# Patient Record
Sex: Female | Born: 1970 | Race: White | Hispanic: No | Marital: Married | Smoking: Never smoker
Health system: Southern US, Community
[De-identification: ages and names within clinical notes are randomized; demographics above are authoritative.]

## PROBLEM LIST (undated history)

## (undated) DIAGNOSIS — E119 Type 2 diabetes mellitus without complications: Secondary | ICD-10-CM

## (undated) DIAGNOSIS — I10 Essential (primary) hypertension: Secondary | ICD-10-CM

## (undated) DIAGNOSIS — G43909 Migraine, unspecified, not intractable, without status migrainosus: Secondary | ICD-10-CM

## (undated) HISTORY — PX: NO PAST SURGERIES: SHX2092

---

## 1999-05-28 ENCOUNTER — Other Ambulatory Visit: Admission: RE | Admit: 1999-05-28 | Discharge: 1999-05-28 | Payer: Self-pay | Admitting: Obstetrics & Gynecology

## 1999-10-01 ENCOUNTER — Encounter: Admission: RE | Admit: 1999-10-01 | Discharge: 1999-12-30 | Payer: Self-pay | Admitting: Obstetrics & Gynecology

## 1999-12-04 ENCOUNTER — Encounter: Payer: Self-pay | Admitting: *Deleted

## 1999-12-04 ENCOUNTER — Observation Stay (HOSPITAL_COMMUNITY): Admission: AD | Admit: 1999-12-04 | Discharge: 1999-12-05 | Payer: Self-pay | Admitting: Obstetrics & Gynecology

## 1999-12-07 ENCOUNTER — Encounter: Payer: Self-pay | Admitting: *Deleted

## 1999-12-08 ENCOUNTER — Inpatient Hospital Stay (HOSPITAL_COMMUNITY): Admission: AD | Admit: 1999-12-08 | Discharge: 1999-12-10 | Payer: Self-pay | Admitting: *Deleted

## 2000-01-23 ENCOUNTER — Other Ambulatory Visit: Admission: RE | Admit: 2000-01-23 | Discharge: 2000-01-23 | Payer: Self-pay | Admitting: Obstetrics & Gynecology

## 2001-03-16 ENCOUNTER — Other Ambulatory Visit: Admission: RE | Admit: 2001-03-16 | Discharge: 2001-03-16 | Payer: Self-pay | Admitting: Obstetrics & Gynecology

## 2002-04-20 ENCOUNTER — Other Ambulatory Visit: Admission: RE | Admit: 2002-04-20 | Discharge: 2002-04-20 | Payer: Self-pay | Admitting: Obstetrics & Gynecology

## 2002-12-07 ENCOUNTER — Inpatient Hospital Stay (HOSPITAL_COMMUNITY): Admission: AD | Admit: 2002-12-07 | Discharge: 2002-12-09 | Payer: Self-pay | Admitting: Obstetrics & Gynecology

## 2003-01-18 ENCOUNTER — Other Ambulatory Visit: Admission: RE | Admit: 2003-01-18 | Discharge: 2003-01-18 | Payer: Self-pay | Admitting: Obstetrics & Gynecology

## 2005-11-14 ENCOUNTER — Inpatient Hospital Stay (HOSPITAL_COMMUNITY): Admission: AD | Admit: 2005-11-14 | Discharge: 2005-11-14 | Payer: Self-pay | Admitting: Obstetrics and Gynecology

## 2005-11-17 ENCOUNTER — Inpatient Hospital Stay (HOSPITAL_COMMUNITY): Admission: AD | Admit: 2005-11-17 | Discharge: 2005-11-19 | Payer: Self-pay | Admitting: Obstetrics & Gynecology

## 2008-01-02 ENCOUNTER — Ambulatory Visit: Payer: Self-pay | Admitting: Family Medicine

## 2008-01-09 ENCOUNTER — Ambulatory Visit: Payer: Self-pay | Admitting: Family Medicine

## 2008-04-19 ENCOUNTER — Ambulatory Visit: Payer: Self-pay | Admitting: Family Medicine

## 2009-10-23 ENCOUNTER — Ambulatory Visit: Payer: Self-pay | Admitting: Internal Medicine

## 2009-11-13 IMAGING — CR DG FOOT COMPLETE 3+V*L*
1 series · 3 of 3 positions shown · non-contrast
Comparison: none

REASON FOR EXAM: Fall, pain
COMMENTS:

PROCEDURE:     MDR - MDR FOOT LT COMP W/OBLQUES  - April 19, 2008  [DATE]
RESULT:     There does not appear to be evidence of fracture, dislocation or
malalignment.

[Series 1: view not recorded · 0.17mm/px · 3 of 3 slices shown]
[im 1/3]
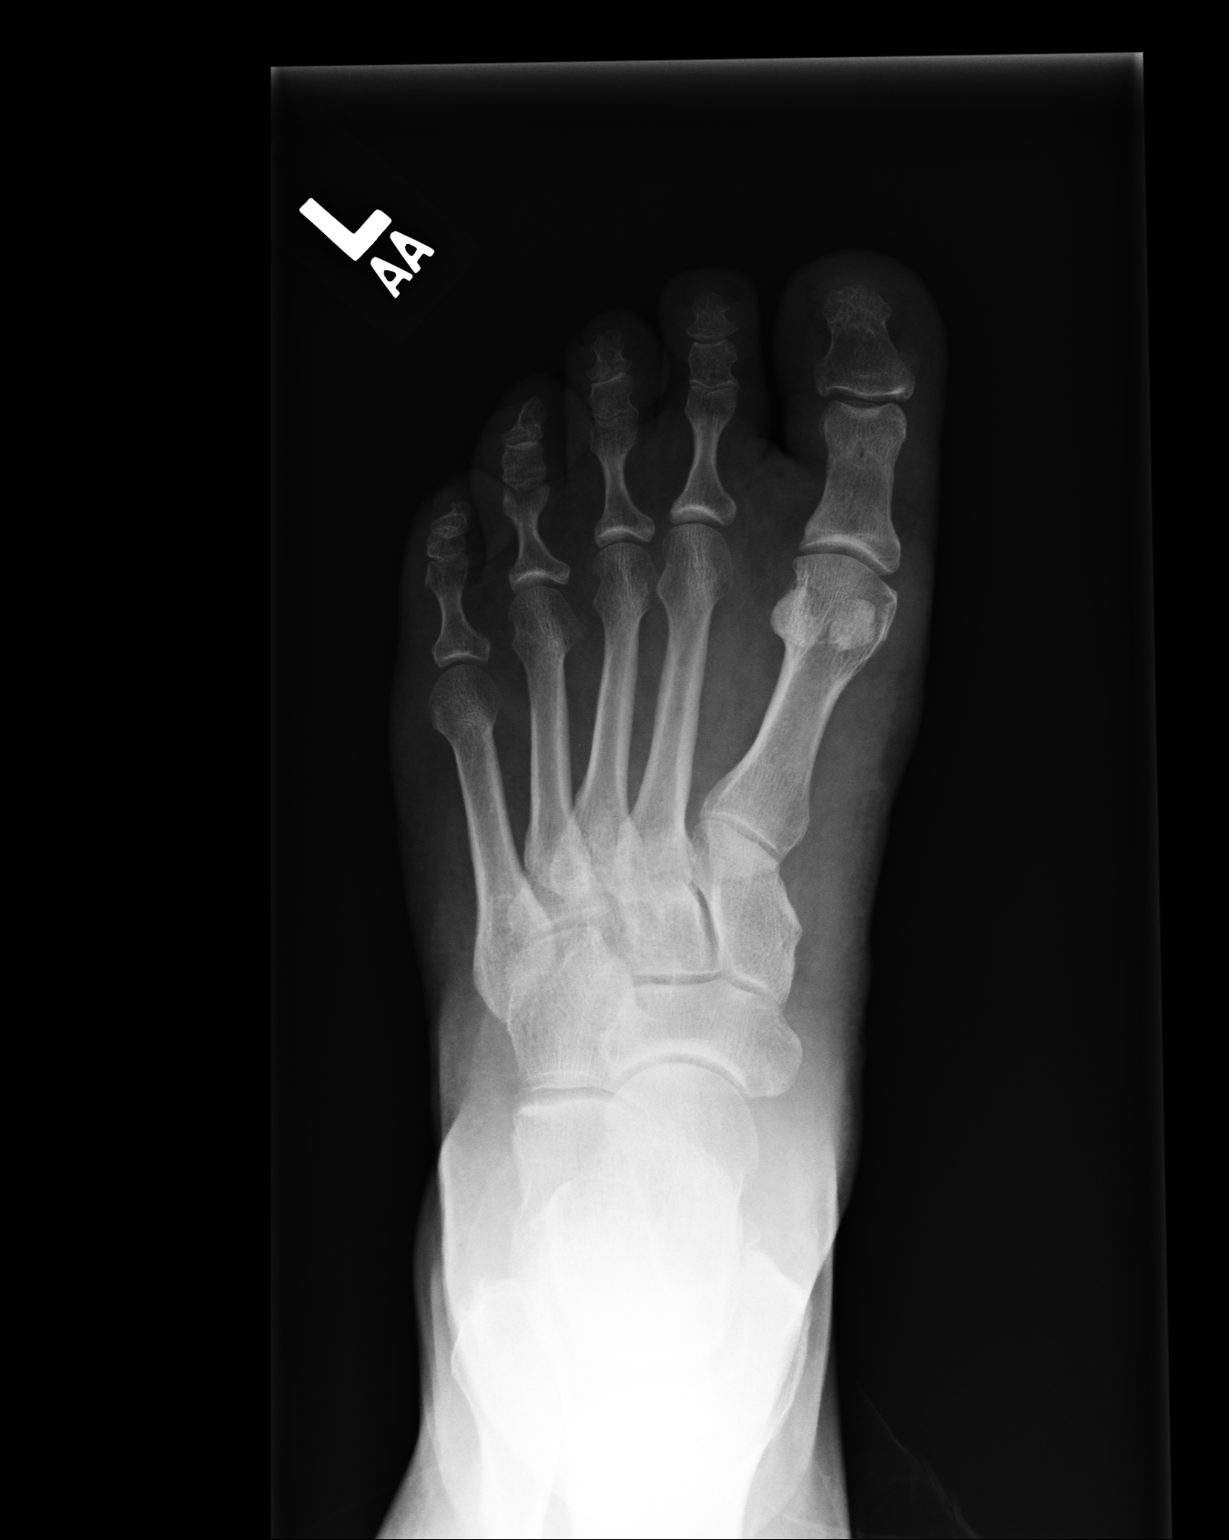
[im 2/3]
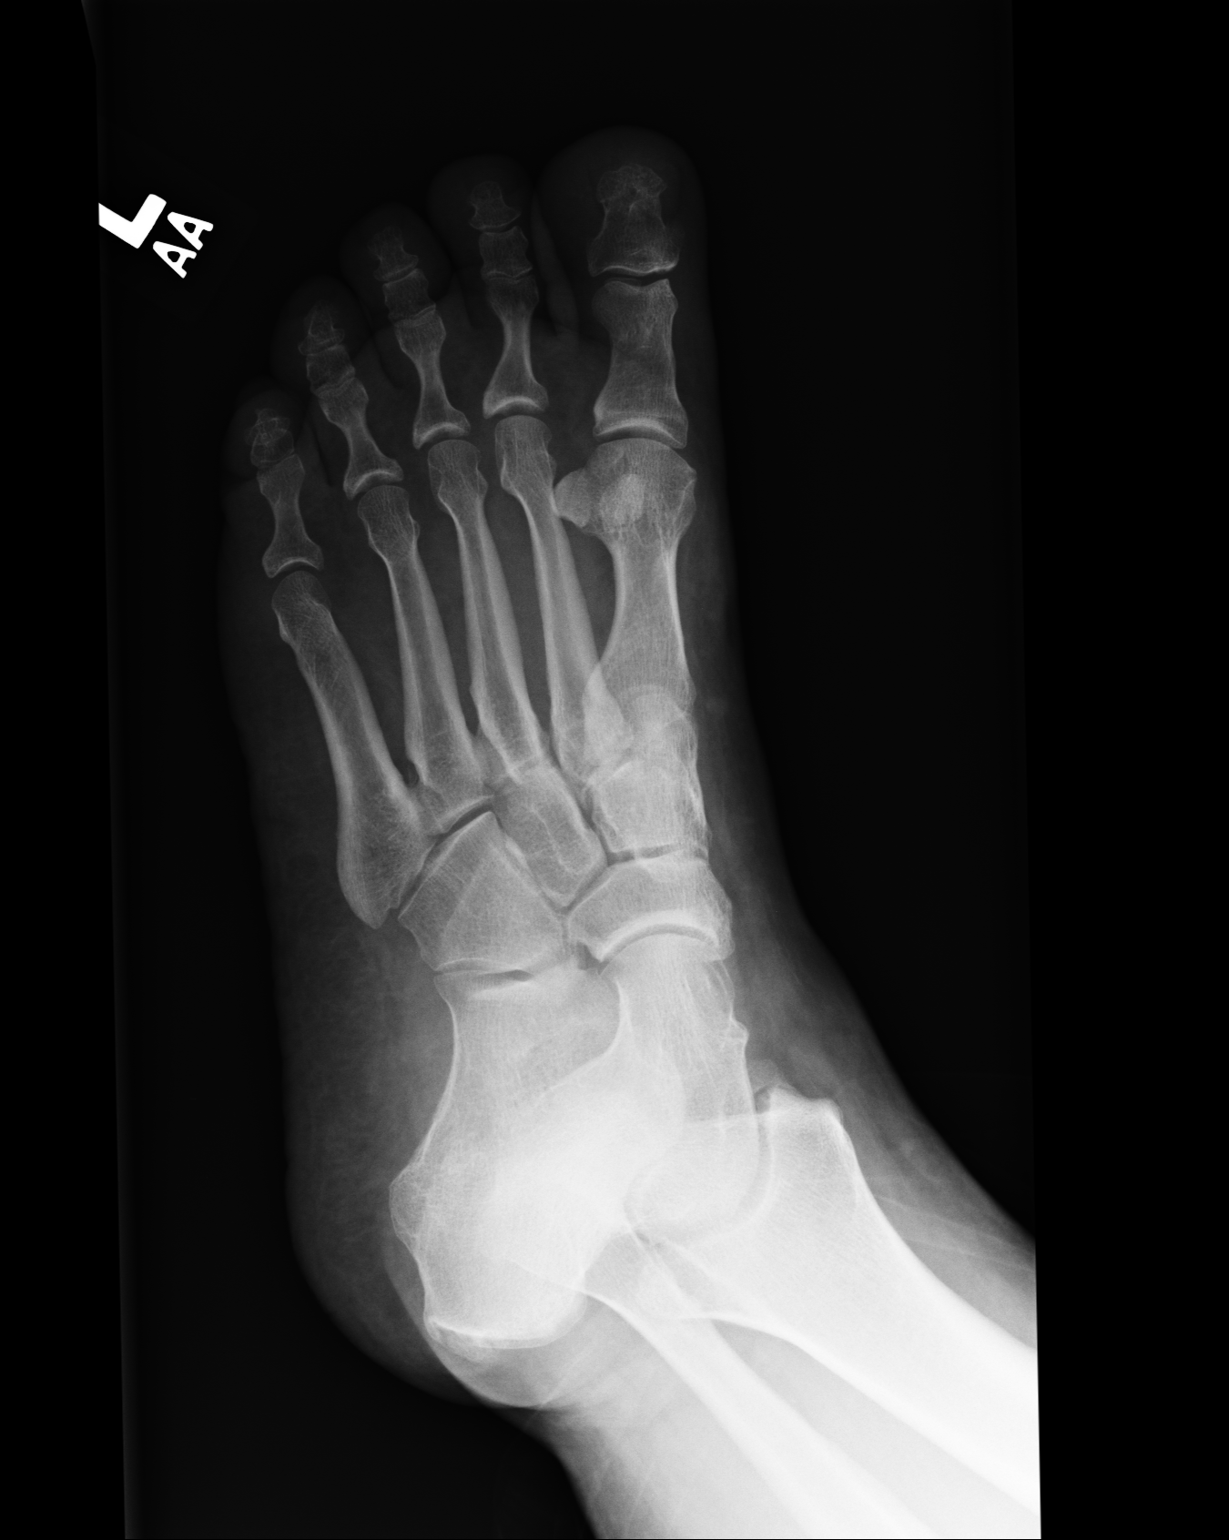
[im 3/3]
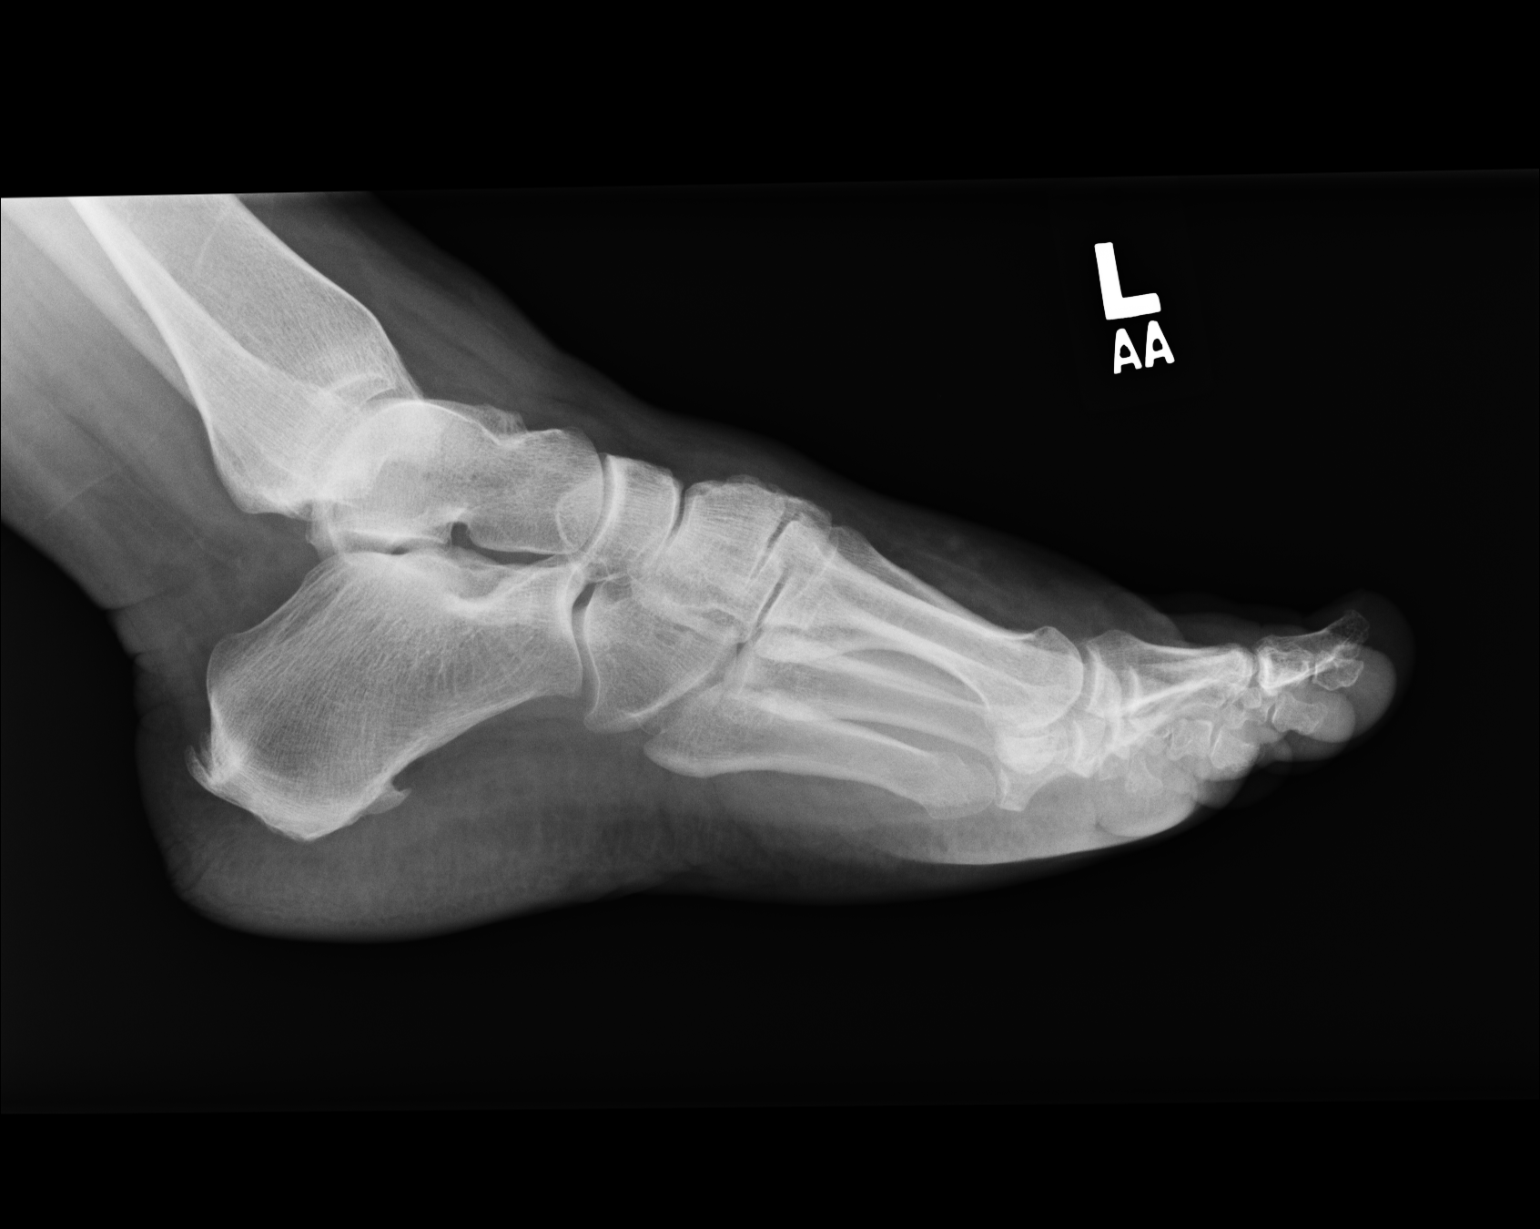

[3 of 3 positions shown; findings below may reference images not displayed]

IMPRESSION: 1.     No evidence of acute osseous abnormalities.
2.     If there is persistent clinical concern or persistent complaints of
pain, repeat evaluation in 7-10 days is recommended, if clinically
warranted.

## 2013-07-25 ENCOUNTER — Ambulatory Visit: Payer: Self-pay | Admitting: Physician Assistant

## 2013-07-25 LAB — RAPID INFLUENZA A&B ANTIGENS

## 2013-10-09 ENCOUNTER — Ambulatory Visit: Payer: Self-pay | Admitting: Family Medicine

## 2014-12-29 ENCOUNTER — Other Ambulatory Visit: Payer: Self-pay | Admitting: Family Medicine

## 2014-12-29 DIAGNOSIS — Z1231 Encounter for screening mammogram for malignant neoplasm of breast: Secondary | ICD-10-CM

## 2015-01-08 ENCOUNTER — Ambulatory Visit
Admission: RE | Admit: 2015-01-08 | Discharge: 2015-01-08 | Disposition: A | Discharge status: Home / Self Care | Payer: Federal, State, Local not specified - PPO | Source: Ambulatory Visit | Attending: Family Medicine | Admitting: Family Medicine

## 2015-01-08 DIAGNOSIS — Z1231 Encounter for screening mammogram for malignant neoplasm of breast: Secondary | ICD-10-CM | POA: Insufficient documentation

## 2017-01-05 ENCOUNTER — Other Ambulatory Visit: Payer: Self-pay | Admitting: Family Medicine

## 2017-01-05 DIAGNOSIS — Z1239 Encounter for other screening for malignant neoplasm of breast: Secondary | ICD-10-CM

## 2018-07-19 ENCOUNTER — Ambulatory Visit
Admission: EM | Admit: 2018-07-19 | Discharge: 2018-07-19 | Disposition: A | Discharge status: Home / Self Care | Payer: Federal, State, Local not specified - PPO | Attending: Family Medicine | Admitting: Family Medicine

## 2018-07-19 ENCOUNTER — Encounter: Payer: Self-pay | Admitting: Emergency Medicine

## 2018-07-19 ENCOUNTER — Other Ambulatory Visit: Payer: Self-pay

## 2018-07-19 DIAGNOSIS — J0100 Acute maxillary sinusitis, unspecified: Secondary | ICD-10-CM | POA: Insufficient documentation

## 2018-07-19 DIAGNOSIS — J209 Acute bronchitis, unspecified: Secondary | ICD-10-CM

## 2018-07-19 HISTORY — DX: Migraine, unspecified, not intractable, without status migrainosus: G43.909

## 2018-07-19 HISTORY — DX: Essential (primary) hypertension: I10

## 2018-07-19 HISTORY — DX: Type 2 diabetes mellitus without complications: E11.9

## 2018-07-19 MED ORDER — HYDROCOD POLST-CPM POLST ER 10-8 MG/5ML PO SUER: 5.0000 mL | Freq: Every evening | ORAL | 0 refills | Status: DC | PRN

## 2018-07-19 MED ORDER — BENZONATATE 100 MG PO CAPS: 100.0000 mg | ORAL_CAPSULE | Freq: Three times a day (TID) | ORAL | 0 refills | Status: DC | PRN

## 2018-07-19 MED ORDER — DOXYCYCLINE HYCLATE 100 MG PO CAPS: 100.0000 mg | ORAL_CAPSULE | Freq: Two times a day (BID) | ORAL | 0 refills | Status: DC

## 2018-07-19 NOTE — ED Provider Notes (Signed)
[facility] ____________________________________________  Time seen: Approximately 9:28 AM  I have reviewed the triage vital signs and the nursing notes.   HISTORY  Chief Complaint Cough   HPI Christina Moses is a 48 y.o. female present for evaluation of 3 weeks of cough, nasal congestion, sinus pressure, postnasal drainage.  Patient reports cough is occasionally productive, often nonproductive.  Lots of thick nasal drainage.  Denies sore throat.  Denies known fevers.  Has continue remain active.  Unresolved with multiple over-the-counter cough and congestion agents.  Several sick contacts at work, intermittently at home.  Continues to overall eat and drink well.  Denies other aggravating or alleviating factors.  Denies chest pain or shortness of breath.  Denies recent sickness.  [place], [facility]: PCP    Past Medical History:  Diagnosis Date  . Diabetes mellitus without complication ([place])   . Hypertension   . Migraine     There are no active problems to display for this patient.   Past Surgical History:  Procedure Laterality Date  . NO PAST SURGERIES       No current facility-administered medications for this encounter.   Current Outpatient Medications:  .  drospirenone-ethinyl estradiol (YASMIN,ZARAH,SYEDA) 3-0.03 MG tablet, TAKE 1 TABLET BY MOUTH EVERY DAY, Disp: , Rfl:  .  lisinopril (PRINIVIL,ZESTRIL) 5 MG tablet, Take by mouth., Disp: , Rfl:  .  metFORMIN (GLUCOPHAGE) 500 MG tablet, Take by mouth., Disp: , Rfl:  .  SUMAtriptan (IMITREX) 50 MG tablet, 1 tablet(s) by mouth at onset of headache, repeat every 2 hours until relief up to maximum 4 tablets in 24 hours, Disp: , Rfl:  .  benzonatate (TESSALON PERLES) 100 MG capsule, Take 1 capsule (100 mg total) by mouth 3 (three) times daily as needed., Disp: 15 capsule, Rfl: 0 .  chlorpheniramine-HYDROcodone (TUSSIONEX PENNKINETIC ER) 10-8 MG/5ML SUER, Take 5 mLs by mouth at bedtime as needed. do not  drive or operate machinery while taking as can cause drowsiness., Disp: 50 mL, Rfl: 0 .  doxycycline (VIBRAMYCIN) 100 MG capsule, Take 1 capsule (100 mg total) by mouth 2 (two) times daily., Disp: 20 capsule, Rfl: 0  Allergies Patient has no known allergies.  Family History  Problem Relation Age of Onset  . Rheum arthritis Mother   . Hypertension Father   . Diabetes Father     Social History Social History   Tobacco Use  . Smoking status: Never Smoker  . Smokeless tobacco: Never Used  Substance Use Topics  . Alcohol use: Never    Frequency: Never  . Drug use: Never    Review of Systems Constitutional: No fever ENT: as above.  Cardiovascular: Denies chest pain. Respiratory: Denies shortness of breath. Gastrointestinal: No abdominal pain.   Musculoskeletal: Negative for back pain. Skin: Negative for rash.   ____________________________________________   PHYSICAL EXAM:  VITAL SIGNS: ED Triage Vitals  Enc Vitals Group     BP 07/19/18 0846 (!) 137/94     Pulse Rate 07/19/18 0846 94     Resp 07/19/18 0846 18     Temp 07/19/18 0846 98.6 F (37 C)     Temp Source 07/19/18 0846 Oral     SpO2 07/19/18 0846 100 %     Weight 07/19/18 0847 245 lb (111.1 kg)     Height 07/19/18 0847 5\' 3"  (1.6 m)     Head Circumference --      Peak Flow --      Pain Score 07/19/18 0846  0     Pain Loc --      Pain Edu? --      Excl. in [place]? --     Constitutional: Alert and oriented. Well appearing and in no acute distress. Eyes: Conjunctivae are normal.  Head: Atraumatic.Mild to moderate tenderness to palpation bilateral maxillary sinuses.  Minimal frontal sinus tenderness palpation.  No swelling. No erythema.   Ears: no erythema, normal TMs bilaterally.   Nose: nasal congestion with bilateral nasal turbinate erythema and edema.   Mouth/Throat: Mucous membranes are moist.  Oropharynx non-erythematous.No tonsillar swelling or exudate.  Neck: No stridor.  No cervical spine tenderness  to palpation. Hematological/Lymphatic/Immunilogical: No cervical lymphadenopathy. Cardiovascular: Normal rate, regular rhythm. Grossly normal heart sounds.  Good peripheral circulation. Respiratory: Normal respiratory effort.  No retractions.No wheezes, rales or rhonchi. Good air movement.  Musculoskeletal: Steady gait. Neurologic:  Normal speech and language. No gait instability. Skin:  Skin is warm, dry and intact. No rash noted. Psychiatric: Mood and affect are normal. Speech and behavior are normal.  ___________________________________________   LABS (all labs ordered are listed, but only abnormal results are displayed)  Labs Reviewed - No data to display  PROCEDURES Procedures    INITIAL IMPRESSION / ASSESSMENT AND PLAN / ED COURSE  Pertinent labs & imaging results that were available during my care of the patient were reviewed by me and considered in my medical decision making (see chart for details).  Well-appearing patient.  No acute distress.  Suspect maxillary sinusitis and bronchitis.  Will treat with oral doxycycline, PRN Tessalon Perles and.  Tussionex.  Encourage rest, fluids, supportive care.Discussed indication, risks and benefits of medications with patient.  Discussed follow up with Primary care physician this week. Discussed follow up and return parameters including no resolution or any worsening concerns. Patient verbalized understanding and agreed to plan.   ____________________________________________   FINAL CLINICAL IMPRESSION(S) / ED DIAGNOSES  Final diagnoses:  Acute maxillary sinusitis, recurrence not specified  Acute bronchitis, unspecified organism     ED Discharge Orders         Ordered    doxycycline (VIBRAMYCIN) 100 MG capsule  2 times daily     07/19/18 0911    benzonatate (TESSALON PERLES) 100 MG capsule  3 times daily PRN     07/19/18 0911    chlorpheniramine-HYDROcodone (TUSSIONEX PENNKINETIC ER) 10-8 MG/5ML SUER  At bedtime PRN      07/19/18 0911           Note: This dictation was prepared with Dragon dictation along with smaller phrase technology. Any transcriptional errors that result from this process are unintentional.         Marylene Land, NP 07/19/18 [phone]

## 2018-07-19 NOTE — ED Triage Notes (Signed)
Patient in today c/o 3 week history of productive cough (green), sinus pain/pressure and ear pressure. Patient denies fever. Patient has tried OTC Mucinex, Tylenol sinus without relief.

## 2018-07-19 NOTE — Discharge Instructions (Addendum)
Take medication as prescribed. Rest. Drink plenty of fluids.  ° °Follow up with your primary care physician this week as needed. Return to Urgent care for new or worsening concerns.  ° °

## 2018-07-30 ENCOUNTER — Ambulatory Visit
Admission: EM | Admit: 2018-07-30 | Discharge: 2018-07-30 | Disposition: A | Discharge status: Home / Self Care | Payer: Federal, State, Local not specified - PPO | Attending: Family Medicine | Admitting: Family Medicine

## 2018-07-30 DIAGNOSIS — J0140 Acute pansinusitis, unspecified: Secondary | ICD-10-CM

## 2018-07-30 MED ORDER — AMOXICILLIN-POT CLAVULANATE 875-125 MG PO TABS: 1.0000 | ORAL_TABLET | Freq: Two times a day (BID) | ORAL | 0 refills | Status: DC

## 2018-07-30 NOTE — ED Provider Notes (Signed)
[facility] ____________________________________________  Time seen: Approximately 5:54 PM  I have reviewed the triage vital signs and the nursing notes.   HISTORY  Chief Complaint Cough   HPI Christina Moses is a 48 y.o. female presenting for continued sinus congestion and sinus pressure.  Patient has been sick for approximately 1 month with cough and congestion complaints.  Was seen in urgent care 1.5 weeks ago and diagnosed with sinusitis and bronchitis, and reports she has since completed her doxycycline prescription and cough medication.  States that her cough is much better, and only has mild coughing now.  Patient reports continues with sinus congestion, sinus pressure and right ear discomfort.  Continues to get a lot of drainage out.  Denies definite fevers.  Denies chest pain or shortness of breath.  Has taken occasional Tylenol cold and sinus.  Denies other aggravating alleviating factors reports otherwise doing well denies other complaints.  [place], [facility]: PCP   Past Medical History:  Diagnosis Date  . Diabetes mellitus without complication ([place])   . Hypertension   . Migraine     There are no active problems to display for this patient.   Past Surgical History:  Procedure Laterality Date  . NO PAST SURGERIES       No current facility-administered medications for this encounter.   Current Outpatient Medications:  .  amoxicillin-clavulanate (AUGMENTIN) 875-125 MG tablet, Take 1 tablet by mouth every 12 (twelve) hours., Disp: 20 tablet, Rfl: 0 .  chlorpheniramine-HYDROcodone (TUSSIONEX PENNKINETIC ER) 10-8 MG/5ML SUER, Take 5 mLs by mouth at bedtime as needed. do not drive or operate machinery while taking as can cause drowsiness., Disp: 50 mL, Rfl: 0 .  drospirenone-ethinyl estradiol (YASMIN,ZARAH,SYEDA) 3-0.03 MG tablet, TAKE 1 TABLET BY MOUTH EVERY DAY, Disp: , Rfl:  .  lisinopril (PRINIVIL,ZESTRIL) 5 MG tablet, Take by mouth., Disp: , Rfl:    .  metFORMIN (GLUCOPHAGE) 500 MG tablet, Take by mouth., Disp: , Rfl:  .  SUMAtriptan (IMITREX) 50 MG tablet, 1 tablet(s) by mouth at onset of headache, repeat every 2 hours until relief up to maximum 4 tablets in 24 hours, Disp: , Rfl:   Allergies Patient has no known allergies.  Family History  Problem Relation Age of Onset  . Rheum arthritis Mother   . Hypertension Father   . Diabetes Father     Social History Social History   Tobacco Use  . Smoking status: Never Smoker  . Smokeless tobacco: Never Used  Substance Use Topics  . Alcohol use: Never    Frequency: Never  . Drug use: Never    Review of Systems Constitutional: No fever ENT: as above.  Cardiovascular: Denies chest pain. Respiratory: Denies shortness of breath. Gastrointestinal: No abdominal pain.  Musculoskeletal: Negative for back pain. Skin: Negative for rash.   ____________________________________________   PHYSICAL EXAM:  VITAL SIGNS: ED Triage Vitals [07/30/18 1715]  Enc Vitals Group     BP (!) 157/90     Pulse Rate 98     Resp 20     Temp 98.5 F (36.9 C)     Temp Source Oral     SpO2 98 %     Weight      Height      Head Circumference      Peak Flow      Pain Score 7     Pain Loc      Pain Edu?      Excl. in [place]?  Constitutional: Alert and oriented. Well appearing and in no acute distress. Eyes: Conjunctivae are normal. Head: Atraumatic.Mild to moderate tenderness to palpation bilateral frontal and maxillary sinuses. No swelling. No erythema.   Ears: no erythema, normal TMs bilaterally.   Nose: nasal congestion with bilateral nasal turbinate erythema and edema.   Mouth/Throat: Mucous membranes are moist.  Oropharynx non-erythematous.No tonsillar swelling or exudate.  Neck: No stridor.  No cervical spine tenderness to palpation. Hematological/Lymphatic/Immunilogical: No cervical lymphadenopathy. Cardiovascular: Normal rate, regular rhythm. Grossly normal heart sounds.  Good  peripheral circulation. Respiratory: Normal respiratory effort.  No retractions. No wheezes, rales or rhonchi. Good air movement.  Musculoskeletal: Steady gait. Neurologic:  Normal speech and language. No gait instability. Skin:  Skin is warm, dry and intact. No rash noted. Psychiatric: Mood and affect are normal. Speech and behavior are normal. ___________________________________________   LABS (all labs ordered are listed, but only abnormal results are displayed)    PROCEDURES Procedures  INITIAL IMPRESSION / ASSESSMENT AND PLAN / ED COURSE  Pertinent labs & imaging results that were available during my care of the patient were reviewed by me and considered in my medical decision making (see chart for details).  Well-appearing patient.  No acute distress.  Suspect sinusitis.  Will treat with oral Augmentin.  Continue over-the-counter supportive care.  Encourage rest, fluids, supportive care.Discussed indication, risks and benefits of medications with patient.  Discussed follow up with Primary care physician this week. Discussed follow up and return parameters including no resolution or any worsening concerns. Patient verbalized understanding and agreed to plan.   ____________________________________________   FINAL CLINICAL IMPRESSION(S) / ED DIAGNOSES  Final diagnoses:  Acute pansinusitis, recurrence not specified     ED Discharge Orders         Ordered    amoxicillin-clavulanate (AUGMENTIN) 875-125 MG tablet  Every 12 hours     07/30/18 1743           Note: This dictation was prepared with Dragon dictation along with smaller phrase technology. Any transcriptional errors that result from this process are unintentional.         Marylene Land, NP 07/30/18 1757

## 2018-07-30 NOTE — ED Notes (Signed)
Pt in treatment room.  In NAD.  Needs address.  Pt/Fam updated on POC.

## 2018-07-30 NOTE — ED Triage Notes (Signed)
Reports cough is actually better.  Right side of face is tender to touch.  Increased nasal drainage. Ears feel as if they have fluid in it.  Feels as if sinus issues have worsened since last seen here on 1/06.   The coughing has been ongoing since "before christmas".

## 2018-07-30 NOTE — Discharge Instructions (Addendum)
Take medication as prescribed. Rest. Drink plenty of fluids.  ° °Follow up with your primary care physician this week as needed. Return to Urgent care for new or worsening concerns.  ° °

## 2019-03-01 ENCOUNTER — Other Ambulatory Visit: Payer: Self-pay | Admitting: Cardiology

## 2019-03-01 ENCOUNTER — Ambulatory Visit
Admission: RE | Admit: 2019-03-01 | Discharge: 2019-03-01 | Disposition: A | Discharge status: Home / Self Care | Payer: Federal, State, Local not specified - PPO | Source: Ambulatory Visit | Attending: Cardiology | Admitting: Cardiology

## 2019-03-01 ENCOUNTER — Other Ambulatory Visit: Payer: Self-pay

## 2019-03-01 DIAGNOSIS — R0602 Shortness of breath: Secondary | ICD-10-CM

## 2019-03-01 LAB — POCT I-STAT CREATININE: Creatinine, Ser: 0.6 mg/dL (ref 0.44–1.00)

## 2019-03-01 MED ORDER — IOHEXOL 350 MG/ML SOLN
75.0000 mL | Freq: Once | INTRAVENOUS | Status: AC | PRN
  Administered 2019-03-01: 75 mL via INTRAVENOUS

## 2020-01-24 ENCOUNTER — Ambulatory Visit
Admission: EM | Admit: 2020-01-24 | Discharge: 2020-01-24 | Disposition: A | Discharge status: Home / Self Care | Payer: Federal, State, Local not specified - PPO | Attending: Emergency Medicine | Admitting: Emergency Medicine

## 2020-01-24 ENCOUNTER — Other Ambulatory Visit: Payer: Self-pay

## 2020-01-24 DIAGNOSIS — R319 Hematuria, unspecified: Secondary | ICD-10-CM | POA: Insufficient documentation

## 2020-01-24 DIAGNOSIS — R102 Pelvic and perineal pain: Secondary | ICD-10-CM | POA: Diagnosis present

## 2020-01-24 DIAGNOSIS — N390 Urinary tract infection, site not specified: Secondary | ICD-10-CM | POA: Insufficient documentation

## 2020-01-24 LAB — URINALYSIS, COMPLETE (UACMP) WITH MICROSCOPIC
Bilirubin Urine: NEGATIVE
Glucose, UA: NEGATIVE mg/dL
Ketones, ur: 15 mg/dL — AB
Nitrite: NEGATIVE
Protein, ur: 100 mg/dL — AB
Specific Gravity, Urine: 1.025 (ref 1.005–1.030)
pH: 5.5 (ref 5.0–8.0)

## 2020-01-24 LAB — WET PREP, GENITAL
Clue Cells Wet Prep HPF POC: NONE SEEN
Sperm: NONE SEEN
Trich, Wet Prep: NONE SEEN
Yeast Wet Prep HPF POC: NONE SEEN

## 2020-01-24 LAB — GLUCOSE, CAPILLARY: Glucose-Capillary: 162 mg/dL — ABNORMAL HIGH (ref 70–99)

## 2020-01-24 MED ORDER — IBUPROFEN 600 MG PO TABS: 600.0000 mg | ORAL_TABLET | Freq: Four times a day (QID) | ORAL | 0 refills | Status: AC | PRN

## 2020-01-24 MED ORDER — PHENAZOPYRIDINE HCL 200 MG PO TABS: 200.0000 mg | ORAL_TABLET | Freq: Three times a day (TID) | ORAL | 0 refills | Status: AC | PRN

## 2020-01-24 MED ORDER — CEPHALEXIN 500 MG PO CAPS: 500.0000 mg | ORAL_CAPSULE | Freq: Four times a day (QID) | ORAL | 0 refills | Status: AC

## 2020-01-24 NOTE — Discharge Instructions (Addendum)
Your wet prep was negative for BV or yeast.  Your urine is suggestive of a urinary tract infection.  I am sending it off for culture to make sure that we have you on the right antibiotic and to confirm the diagnosis.  Take 600 mg of ibuprofen combined with 1000 mg of Tylenol together 3-4 times a day as needed for pain.  Finish the Keflex unless a provider tells you to stop.  Go immediately to the ER for fevers above 100.4, pain not controlled with Tylenol/ibuprofen combination, or for any other concerns.

## 2020-01-24 NOTE — ED Triage Notes (Signed)
Patient states that she started having pelvic pain and bladder pressure on Sunday. States that the pain radiates to her back and she has seen an increase in urinary frequency.

## 2020-01-24 NOTE — ED Provider Notes (Signed)
HPI  SUBJECTIVE:  Christina Moses is a 49 y.o. female who presents with 2 days of low, constant midline pelvic pain described as dull, achy, pressure with radiation to the back.  She reports 1 episode of emesis yesterday secondary to the pain.  She reports feeling feverish with chills yesterday, but did not take her temperature.  She reports urinary urgency.  Denies dysuria, frequency, cloudy or odorous urine, hematuria.  No vaginal odor, bleeding, discharge, itching, genital rash.  No diarrhea, constipation.  Last bowel movement was normal yesterday.  She has never had symptoms like this before.  She is in a long-term monogamous relationship with her husband who is asymptomatic.  STDs are not a concern today.  The pain is not getting any worse, but is not getting any better.  No antipyretic in the past 6 hours.  She has tried ibuprofen 400 mg as needed and Aleve without improvement in her symptoms.  She states that symptoms are worse with walking, movement.  States that he gets worse 2 to 3 hours after eating as well.  It does not is appear to be associated with urination, defecation.  States that the car ride over here was not painful.  Past medical history of atrial fibrillation on Cardizem, no Eliquis at this time due to anemia, diabetes, hypertension, vaginal yeast infections.  No history of UTI, pyelonephritis, nephrolithiasis, ovarian cyst, PCOS, STDs, BV, PID, diverticulitis, abdominal surgeries, appendicitis, DKA/HHNK.  Does not check her glucose at home.  States that she is compliant with Metformin.  LMP 4 days ago.  Denies the possibility of being pregnant.  Family history significant for both brothers and father with nephrolithiasis.  [facility], [facility]   Past Medical History:  Diagnosis Date  . Diabetes mellitus without complication ([place])   . Hypertension   . Migraine     Past Surgical History:  Procedure Laterality Date  . NO PAST SURGERIES      Family History  Problem  Relation Age of Onset  . Rheum arthritis Mother   . Hypertension Father   . Diabetes Father     Social History   Tobacco Use  . Smoking status: Never Smoker  . Smokeless tobacco: Never Used  Vaping Use  . Vaping Use: Never used  Substance Use Topics  . Alcohol use: Never  . Drug use: Never    No current facility-administered medications for this encounter.  Current Outpatient Medications:  .  diltiazem (CARDIZEM CD) 240 MG 24 hr capsule, , Disp: , Rfl:  .  drospirenone-ethinyl estradiol (YASMIN,ZARAH,SYEDA) 3-0.03 MG tablet, TAKE 1 TABLET BY MOUTH EVERY DAY, Disp: , Rfl:  .  furosemide (LASIX) 40 MG tablet, Take 40 mg by mouth daily., Disp: , Rfl:  .  Magnesium Citrate 125 MG CAPS, Take 250 mg by mouth., Disp: , Rfl:  .  metFORMIN (GLUCOPHAGE) 500 MG tablet, Take by mouth., Disp: , Rfl:  .  rosuvastatin (CRESTOR) 5 MG tablet, Take 5 mg by mouth daily., Disp: , Rfl:  .  SUMAtriptan (IMITREX) 50 MG tablet, 1 tablet(s) by mouth at onset of headache, repeat every 2 hours until relief up to maximum 4 tablets in 24 hours, Disp: , Rfl:  .  amoxicillin-clavulanate (AUGMENTIN) 875-125 MG tablet, Take 1 tablet by mouth every 12 (twelve) hours., Disp: 20 tablet, Rfl: 0 .  chlorpheniramine-HYDROcodone (TUSSIONEX PENNKINETIC ER) 10-8 MG/5ML SUER, Take 5 mLs by mouth at bedtime as needed. do not drive or operate machinery while taking as can  cause drowsiness., Disp: 50 mL, Rfl: 0 .  lisinopril (PRINIVIL,ZESTRIL) 5 MG tablet, Take by mouth., Disp: , Rfl:   No Known Allergies   ROS  As noted in HPI.   Physical Exam  BP (!) 174/100 (BP Location: Left Arm)   Pulse 75   Temp 98.3 F (36.8 C) (Oral)   Resp 18   Ht 5\' 3"  (1.6 m)   Wt 113.4 kg   LMP 01/13/2020   SpO2 100%   BMI 44.29 kg/m   Constitutional: Well developed, well nourished, appears uncomfortable Eyes:  EOMI, conjunctiva normal bilaterally HENT: Normocephalic, atraumatic,mucus membranes moist Respiratory: Normal  inspiratory effort Cardiovascular: Normal rate GI: nondistended soft, positive right flank tenderness.  Positive right lower quadrant tenderness.  Negative Rovsing.  No guarding, rebound.  Active bowel sounds.  No suprapubic tenderness.  Negative Murphy. back: Right CVA tenderness GU: External genitalia normal.  Normal vaginal mucosa.  Normal os.  No discharge.  Uterus NT. No CMT. + R adnexal tenderness. No adnexal masses.  Chaperone present during exam skin: No rash, skin intact Musculoskeletal: no deformities Neurologic: Alert & oriented x 3, no focal neuro deficits Psychiatric: Speech and behavior appropriate   ED Course   Medications - No data to display  Orders Placed This Encounter  Procedures  . Pelvic exam    Standing Status:   Standing    Number of Occurrences:   1  . Wet prep, genital    Standing Status:   Standing    Number of Occurrences:   1  . Urine culture    Standing Status:   Standing    Number of Occurrences:   1    Order Specific Question:   List patient's active antibiotics    Answer:   keflerx or macrobid  . Urinalysis, Complete w Microscopic    Standing Status:   Standing    Number of Occurrences:   1  . Glucose, capillary    Standing Status:   Standing    Number of Occurrences:   1  . CBG monitoring, ED    Standing Status:   Standing    Number of Occurrences:   1    Results for orders placed or performed during the hospital encounter of 01/24/20 (from the past 24 hour(s))  Urinalysis, Complete w Microscopic     Status: Abnormal   Collection Time: 01/24/20  8:24 AM  Result Value Ref Range   Color, Urine YELLOW YELLOW   APPearance CLOUDY (A) CLEAR   Specific Gravity, Urine 1.025 1.005 - 1.030   pH 5.5 5.0 - 8.0   Glucose, UA NEGATIVE NEGATIVE mg/dL   Hgb urine dipstick TRACE (A) NEGATIVE   Bilirubin Urine NEGATIVE NEGATIVE   Ketones, ur 15 (A) NEGATIVE mg/dL   Protein, ur 100 (A) NEGATIVE mg/dL   Nitrite NEGATIVE NEGATIVE   Leukocytes,Ua  SMALL (A) NEGATIVE   Squamous Epithelial / LPF 0-5 0 - 5   WBC, UA 6-10 0 - 5 WBC/hpf   RBC / HPF 11-20 0 - 5 RBC/hpf   Bacteria, UA FEW (A) NONE SEEN  Glucose, capillary     Status: Abnormal   Collection Time: 01/24/20  8:58 AM  Result Value Ref Range   Glucose-Capillary 162 (H) 70 - 99 mg/dL  Wet prep, genital     Status: Abnormal   Collection Time: 01/24/20  9:03 AM   Specimen: Vaginal  Result Value Ref Range   Yeast Wet Prep HPF POC NONE SEEN NONE SEEN  Trich, Wet Prep NONE SEEN NONE SEEN   Clue Cells Wet Prep HPF POC NONE SEEN NONE SEEN   WBC, Wet Prep HPF POC FEW (A) NONE SEEN   Sperm NONE SEEN    No results found.  ED Clinical Impression  1. Urinary tract infection with hematuria, site unspecified   2. Pelvic pain in female     ED Assessment/Plan  UA with trace hematuria, ketones, small leukocytes and a few bacteria.  Given the history and physical, concern for UTI/early pyelonephritis.  Will send urine off for culture to confirm diagnosis and antibiotic choice.  Will send home with Keflex, Pyridium, Tylenol/ibuprofen 3-4 times a day as needed for pain.  Doubt ovarian torsion given constant nature of pain.  Suspect ovarian cyst/ruptured ovarian cyst the right adnexal tenderness.  Appendicitis also in the differential, however feel that this is less likely.  Her abdomen is benign.  Wet prep negative.  will treat for complicated UTI with Keflex 4 times daily and will treat her pelvic pain symptomatically with Tylenol/ibuprofen.  Gave her strict ER return precautions..  Fingerstick 162.  Discussed labs, medical decision-making, treatment plan and plan for follow-up with patient.  Discussed signs and symptoms/prompt return to the emergency department.  Patient agrees with plan.  No orders of the defined types were placed in this encounter.   *This clinic note was created using Dragon dictation software. Therefore, there may be occasional mistakes despite careful  proofreading.  ?     Melynda Ripple, MD 01/24/20 1056

## 2020-01-27 ENCOUNTER — Telehealth (HOSPITAL_COMMUNITY): Payer: Self-pay

## 2020-01-27 LAB — URINE CULTURE: Culture: >=100000 — AB

## 2020-01-27 MED ORDER — SULFAMETHOXAZOLE-TRIMETHOPRIM 800-160 MG PO TABS: 1.0000 | ORAL_TABLET | Freq: Two times a day (BID) | ORAL | 0 refills | Status: AC

## 2020-01-31 ENCOUNTER — Telehealth: Payer: Self-pay | Admitting: Gastroenterology

## 2020-01-31 ENCOUNTER — Ambulatory Visit: Payer: Federal, State, Local not specified - PPO | Admitting: Gastroenterology

## 2020-01-31 NOTE — Progress Notes (Deleted)
Jonathon Bellows MD, MRCP(U.K) [address]  [place]  [place], [place] [address]  Main: [phone]  Fax: [phone]   Gastroenterology Consultation  Referring Provider:     [facility] Primary Care Physician:  [facility] Gastroenterologist:  Dr. Jonathon Bellows  Reason for Consultation:     Iron deficiency anemia         HPI:   Christina Moses is a 49 y.o. y/o female has been referred for iron deficiency anemia.   01/26/2020: Hemoglobin 8.2 g with an MCV of 73.9. Hemoglobin a month prior was 9.2 g and I do not have any other prior recent lab values to compare with. Iron was less than 5 and ferritin was 24. No B12 or folate checked. Recently diagnosed with new onset atrial fibrillation. Cardiology wanted to start her on Eliquis but were holding off pending evaluation of her anemia. Patient has never had a colonoscopy. Urine showed trace of hemoglobin.    Past Medical History:  Diagnosis Date  . Diabetes mellitus without complication ([name])   . Hypertension   . Migraine     Past Surgical History:  Procedure Laterality Date  . NO PAST SURGERIES      Prior to Admission medications   Medication Sig Start Date End Date Taking? Authorizing Provider  cephALEXin (KEFLEX) 500 MG capsule Take 1 capsule (500 mg total) by mouth 4 (four) times daily for 10 days. 01/24/20 02/03/20  Melynda Ripple, MD  chlorpheniramine-HYDROcodone (TUSSIONEX PENNKINETIC ER) 10-8 MG/5ML SUER Take 5 mLs by mouth at bedtime as needed. do not drive or operate machinery while taking as can cause drowsiness. 07/19/18   Marylene Land, NP  diltiazem (CARDIZEM CD) 240 MG 24 hr capsule  01/18/20   [provider]  drospirenone-ethinyl estradiol (YASMIN,ZARAH,SYEDA) 3-0.03 MG tablet TAKE 1 TABLET BY MOUTH EVERY DAY 12/22/17 01/24/20  [provider]  furosemide (LASIX) 40 MG tablet Take 40 mg by mouth daily. 01/18/20   [provider]  ibuprofen (ADVIL) 600 MG  tablet Take 1 tablet (600 mg total) by mouth every 6 (six) hours as needed. 01/24/20   Melynda Ripple, MD  Magnesium Citrate 125 MG CAPS Take 250 mg by mouth.    [provider]  metFORMIN (GLUCOPHAGE) 500 MG tablet Take by mouth. 06/19/13 01/24/20  [provider]  phenazopyridine (PYRIDIUM) 200 MG tablet Take 1 tablet (200 mg total) by mouth 3 (three) times daily as needed for pain. 01/24/20   Melynda Ripple, MD  rosuvastatin (CRESTOR) 5 MG tablet Take 5 mg by mouth daily. 01/18/20   [provider]  sulfamethoxazole-trimethoprim (BACTRIM DS) 800-160 MG tablet Take 1 tablet by mouth 2 (two) times daily for 7 days. 01/27/20 02/03/20  Chase Picket, MD  SUMAtriptan (IMITREX) 50 MG tablet 1 tablet(s) by mouth at onset of headache, repeat every 2 hours until relief up to maximum 4 tablets in 24 hours 03/12/18 01/24/20  [provider]  lisinopril (PRINIVIL,ZESTRIL) 5 MG tablet Take by mouth. 03/12/18 01/24/20  [provider]    Family History  Problem Relation Age of Onset  . Rheum arthritis Mother   . Hypertension Father   . Diabetes Father      Social History   Tobacco Use  . Smoking status: Never Smoker  . Smokeless tobacco: Never Used  Vaping Use  . Vaping Use: Never used  Substance Use Topics  . Alcohol use: Never  . Drug use: Never  Allergies as of 01/31/2020  . (No Known Allergies)    Review of Systems:    All systems reviewed and negative except where noted in HPI.   Physical Exam:  LMP 01/13/2020  Patient's last menstrual period was 01/13/2020. Psych:  Alert and cooperative. Normal mood and affect. General:   Alert,  Well-developed, well-nourished, pleasant and cooperative in NAD Head:  Normocephalic and atraumatic. Eyes:  Sclera clear, no icterus.   Conjunctiva pink. Ears:  Normal auditory acuity. Lungs:  Respirations even and unlabored.  Clear throughout to auscultation.   No wheezes, crackles, or rhonchi. No acute  distress. Heart:  Regular rate and rhythm; no murmurs, clicks, rubs, or gallops. Abdomen:  Normal bowel sounds.  No bruits.  Soft, non-tender and non-distended without masses, hepatosplenomegaly or hernias noted.  No guarding or rebound tenderness.    Neurologic:  Alert and oriented x3;  grossly normal neurologically. Psych:  Alert and cooperative. Normal mood and affect.  Imaging Studies: No results found.  Assessment and Plan:   Christina Moses is a 49 y.o. y/o female has been referred for severe iron deficiency anemia.  Plan 1. Check B12, folate, celiac serology, H. pylori status 2. Avoid all NSAIDs 3. Refer to Dr. Tasia Catchings in hematology for IV iron 4. EGD plus colonoscopy and if negative will need capsule study of the small bowel.  Follow up in ***  Dr Jonathon Bellows MD,MRCP(U.K)

## 2020-01-31 NOTE — Telephone Encounter (Signed)
Christina Moses from [facility] called to reschedule appt. For next week(didn't give a reason) I informed her that the the [facility] had stated that patient can have the next available appt and that Dr. Vicente Males goes on vacation next week and will only be working two days. I informed Christina Moses that his next Available would be Sept. 21st at 3:30Pm. Christina Moses stated that she would follow up with Dr. Maurilio Lovely to see what she wanted.

## 2020-02-22 ENCOUNTER — Other Ambulatory Visit
Admission: RE | Admit: 2020-02-22 | Discharge: 2020-02-22 | Disposition: A | Discharge status: Home / Self Care | Payer: Federal, State, Local not specified - PPO | Source: Ambulatory Visit | Attending: Gastroenterology | Admitting: Gastroenterology

## 2020-02-22 ENCOUNTER — Other Ambulatory Visit: Payer: Self-pay

## 2020-02-22 DIAGNOSIS — Z01812 Encounter for preprocedural laboratory examination: Secondary | ICD-10-CM | POA: Diagnosis present

## 2020-02-22 DIAGNOSIS — U071 COVID-19: Secondary | ICD-10-CM | POA: Insufficient documentation

## 2020-02-23 LAB — SARS CORONAVIRUS 2 (TAT 6-24 HRS): SARS Coronavirus 2: POSITIVE — AB

## 2020-02-24 ENCOUNTER — Encounter: Admission: RE | Payer: Self-pay | Source: Home / Self Care

## 2020-02-24 ENCOUNTER — Ambulatory Visit: Admission: RE | Admit: 2020-02-24 | Payer: Federal, State, Local not specified - PPO | Source: Home / Self Care

## 2020-02-24 SURGERY — EGD (ESOPHAGOGASTRODUODENOSCOPY)
Anesthesia: General

## 2020-03-21 ENCOUNTER — Other Ambulatory Visit: Admission: RE | Admit: 2020-03-21 | Payer: Federal, State, Local not specified - PPO | Source: Ambulatory Visit

## 2020-03-23 ENCOUNTER — Ambulatory Visit: Payer: Federal, State, Local not specified - PPO | Admitting: Certified Registered Nurse Anesthetist

## 2020-03-23 ENCOUNTER — Encounter: Payer: Self-pay | Admitting: *Deleted

## 2020-03-23 ENCOUNTER — Ambulatory Visit
Admission: RE | Admit: 2020-03-23 | Discharge: 2020-03-23 | Disposition: A | Discharge status: Home / Self Care | Payer: Federal, State, Local not specified - PPO | Attending: Gastroenterology | Admitting: Gastroenterology

## 2020-03-23 ENCOUNTER — Encounter
Admission: RE | Disposition: A | Discharge status: Home / Self Care | Payer: Self-pay | Source: Home / Self Care | Attending: Gastroenterology

## 2020-03-23 DIAGNOSIS — Z793 Long term (current) use of hormonal contraceptives: Secondary | ICD-10-CM | POA: Diagnosis not present

## 2020-03-23 DIAGNOSIS — D122 Benign neoplasm of ascending colon: Secondary | ICD-10-CM | POA: Insufficient documentation

## 2020-03-23 DIAGNOSIS — K3189 Other diseases of stomach and duodenum: Secondary | ICD-10-CM | POA: Diagnosis not present

## 2020-03-23 DIAGNOSIS — K2950 Unspecified chronic gastritis without bleeding: Secondary | ICD-10-CM | POA: Diagnosis not present

## 2020-03-23 DIAGNOSIS — D509 Iron deficiency anemia, unspecified: Secondary | ICD-10-CM | POA: Insufficient documentation

## 2020-03-23 DIAGNOSIS — I10 Essential (primary) hypertension: Secondary | ICD-10-CM | POA: Diagnosis not present

## 2020-03-23 DIAGNOSIS — D123 Benign neoplasm of transverse colon: Secondary | ICD-10-CM | POA: Diagnosis not present

## 2020-03-23 DIAGNOSIS — K640 First degree hemorrhoids: Secondary | ICD-10-CM | POA: Diagnosis not present

## 2020-03-23 DIAGNOSIS — E119 Type 2 diabetes mellitus without complications: Secondary | ICD-10-CM | POA: Diagnosis not present

## 2020-03-23 DIAGNOSIS — Z7984 Long term (current) use of oral hypoglycemic drugs: Secondary | ICD-10-CM | POA: Insufficient documentation

## 2020-03-23 DIAGNOSIS — K5710 Diverticulosis of small intestine without perforation or abscess without bleeding: Secondary | ICD-10-CM | POA: Diagnosis not present

## 2020-03-23 DIAGNOSIS — G43909 Migraine, unspecified, not intractable, without status migrainosus: Secondary | ICD-10-CM | POA: Insufficient documentation

## 2020-03-23 DIAGNOSIS — Z79899 Other long term (current) drug therapy: Secondary | ICD-10-CM | POA: Diagnosis not present

## 2020-03-23 DIAGNOSIS — D125 Benign neoplasm of sigmoid colon: Secondary | ICD-10-CM | POA: Diagnosis not present

## 2020-03-23 HISTORY — PX: ESOPHAGOGASTRODUODENOSCOPY (EGD) WITH PROPOFOL: SHX5813

## 2020-03-23 HISTORY — PX: COLONOSCOPY WITH PROPOFOL: SHX5780

## 2020-03-23 LAB — GLUCOSE, CAPILLARY: Glucose-Capillary: 167 mg/dL — ABNORMAL HIGH (ref 70–99)

## 2020-03-23 LAB — POCT PREGNANCY, URINE: Preg Test, Ur: NEGATIVE

## 2020-03-23 SURGERY — COLONOSCOPY WITH PROPOFOL
Anesthesia: General

## 2020-03-23 MED ORDER — MIDAZOLAM HCL 2 MG/2ML IJ SOLN
INTRAMUSCULAR | Status: AC
  Filled 2020-03-23: qty 2

## 2020-03-23 MED ORDER — LIDOCAINE HCL (CARDIAC) PF 100 MG/5ML IV SOSY
PREFILLED_SYRINGE | INTRAVENOUS | Status: DC | PRN
  Administered 2020-03-23: 50 mg via INTRAVENOUS

## 2020-03-23 MED ORDER — PROPOFOL 10 MG/ML IV BOLUS
INTRAVENOUS | Status: AC
  Filled 2020-03-23: qty 20

## 2020-03-23 MED ORDER — MIDAZOLAM HCL 2 MG/2ML IJ SOLN
INTRAMUSCULAR | Status: DC | PRN
  Administered 2020-03-23: 2 mg via INTRAVENOUS

## 2020-03-23 MED ORDER — PROPOFOL 10 MG/ML IV BOLUS
INTRAVENOUS | Status: DC | PRN
  Administered 2020-03-23: 22 mg via INTRAVENOUS
  Administered 2020-03-23: 90 mg via INTRAVENOUS

## 2020-03-23 MED ORDER — PROPOFOL 500 MG/50ML IV EMUL
INTRAVENOUS | Status: AC
  Filled 2020-03-23: qty 50

## 2020-03-23 MED ORDER — LIDOCAINE HCL (PF) 1 % IJ SOLN
INTRAMUSCULAR | Status: AC
  Filled 2020-03-23: qty 2

## 2020-03-23 MED ORDER — SODIUM CHLORIDE 0.9 % IV SOLN
INTRAVENOUS | Status: DC
  Administered 2020-03-23: 1000 mL via INTRAVENOUS

## 2020-03-23 MED ORDER — PROPOFOL 500 MG/50ML IV EMUL
INTRAVENOUS | Status: DC | PRN
  Administered 2020-03-23: 140 ug/kg/min via INTRAVENOUS

## 2020-03-23 NOTE — Interval H&P Note (Signed)
History and Physical Interval Note:  03/23/2020 10:12 AM  Christina Moses  has presented today for surgery, with the diagnosis of anemia.  The various methods of treatment have been discussed with the patient and family. After consideration of risks, benefits and other options for treatment, the patient has consented to  Procedure(s) with comments: COLONOSCOPY WITH PROPOFOL (N/A) - COVID POSITIVE on 02/22/2020 ESOPHAGOGASTRODUODENOSCOPY (EGD) WITH PROPOFOL (N/A) as a surgical intervention.  The patient's history has been reviewed, patient examined, no change in status, stable for surgery.  I have reviewed the patient's chart and labs.  Questions were answered to the patient's satisfaction.     Lesly Rubenstein  Ok to proceed with EGD/Colonoscopy

## 2020-03-23 NOTE — H&P (Signed)
Outpatient short stay form Pre-procedure 03/23/2020 10:10 AM Raylene Miyamoto MD, MPH  Primary Physician:  Dr. Hoy Morn  Reason for visit:  IDA  History of present illness:   49 y/o lady with newly diagnosed IDA here for EGD/Colonoscopy. No blood thinners. No overt GI bleeding. No history of abdominal surgeries. No family history of GI malignancies. Has a history of heavy NSAID use.    Current Facility-Administered Medications:  .  0.9 %  sodium chloride infusion, , Intravenous, Continuous, Monette Omara, Hilton Cork, MD, Last Rate: 20 mL/hr at 03/23/20 1009, Continued from Pre-op at 03/23/20 1009 .  lidocaine (PF) (XYLOCAINE) 1 % injection, , , ,   Medications Prior to Admission  Medication Sig Dispense Refill Last Dose  . ferrous sulfate 325 (65 FE) MG tablet Take 325 mg by mouth daily with breakfast.     . pantoprazole (PROTONIX) 40 MG tablet Take 40 mg by mouth 2 (two) times daily before a meal. keflex     . sulfamethoxazole-trimethoprim (BACTRIM DS) 800-160 MG tablet Take by mouth.     . tobramycin-dexamethasone (TOBRADEX) ophthalmic ointment Place 1 application into both eyes every 6 (six) hours.     . chlorpheniramine-HYDROcodone (TUSSIONEX PENNKINETIC ER) 10-8 MG/5ML SUER Take 5 mLs by mouth at bedtime as needed. do not drive or operate machinery while taking as can cause drowsiness. 50 mL 0   . diltiazem (CARDIZEM CD) 240 MG 24 hr capsule    03/23/2020 at 0430  . drospirenone-ethinyl estradiol (YASMIN,ZARAH,SYEDA) 3-0.03 MG tablet TAKE 1 TABLET BY MOUTH EVERY DAY     . furosemide (LASIX) 40 MG tablet Take 40 mg by mouth daily.     Marland Kitchen ibuprofen (ADVIL) 600 MG tablet Take 1 tablet (600 mg total) by mouth every 6 (six) hours as needed. 30 tablet 0   . Magnesium Citrate 125 MG CAPS Take 250 mg by mouth.     . metFORMIN (GLUCOPHAGE) 500 MG tablet Take by mouth.     . phenazopyridine (PYRIDIUM) 200 MG tablet Take 1 tablet (200 mg total) by mouth 3 (three) times daily as needed for pain. 6 tablet  0   . rosuvastatin (CRESTOR) 5 MG tablet Take 5 mg by mouth daily.     . SUMAtriptan (IMITREX) 50 MG tablet 1 tablet(s) by mouth at onset of headache, repeat every 2 hours until relief up to maximum 4 tablets in 24 hours        No Known Allergies   Past Medical History:  Diagnosis Date  . Diabetes mellitus without complication ([place])   . Hypertension   . Migraine     Review of systems:  Otherwise negative.    Physical Exam  Gen: Alert, oriented. Appears stated age.  HEENT: /AT. PERRLA. Lungs: No respiratory distress Abd: soft, benign, no masses. Ext: No edema.    Planned procedures: Proceed with EGD/colonoscopy. The patient understands the nature of the planned procedure, indications, risks, alternatives and potential complications including but not limited to bleeding, infection, perforation, damage to internal organs and possible oversedation/side effects from anesthesia. The patient agrees and gives consent to proceed.  Please refer to procedure notes for findings, recommendations and patient disposition/instructions.     Raylene Miyamoto MD, MPH Gastroenterology 03/23/2020  10:10 AM

## 2020-03-23 NOTE — Transfer of Care (Signed)
Immediate Anesthesia Transfer of Care Note  Patient: Christina Moses  Procedure(s) Performed: COLONOSCOPY WITH PROPOFOL (N/A ) ESOPHAGOGASTRODUODENOSCOPY (EGD) WITH PROPOFOL (N/A )  Patient Location: PACU and Endoscopy Unit  Anesthesia Type:General  Level of Consciousness: drowsy  Airway & Oxygen Therapy: Patient Spontanous Breathing and Patient connected to nasal cannula oxygen  Post-op Assessment: Report given to RN and Post -op Vital signs reviewed and stable  Post vital signs: Reviewed and stable  Last Vitals:  Vitals Value Taken Time  BP 111/58 03/23/20 1050  Temp    Pulse 83 03/23/20 1050  Resp 27 03/23/20 1050  SpO2 91 % 03/23/20 1050  Vitals shown include unvalidated device data.  Last Pain:  Vitals:   03/23/20 1050  TempSrc:   PainSc: 0-No pain         Complications: No complications documented.

## 2020-03-23 NOTE — Anesthesia Preprocedure Evaluation (Signed)
Anesthesia Evaluation  Patient identified by MRN, date of birth, ID band Patient awake    Reviewed: Allergy & Precautions, NPO status , Patient's Chart, lab work & pertinent test results  History of Anesthesia Complications Negative for: history of anesthetic complications  Airway Mallampati: III  TM Distance: >3 FB Neck ROM: Full    Dental  (+) Poor Dentition   Pulmonary neg pulmonary ROS, neg sleep apnea, neg COPD,    breath sounds clear to auscultation- rhonchi (-) wheezing      Cardiovascular hypertension, Pt. on medications (-) CAD, (-) Past MI, (-) Cardiac Stents and (-) CABG + dysrhythmias Atrial Fibrillation  Rhythm:Regular Rate:Normal - Systolic murmurs and - Diastolic murmurs    Neuro/Psych  Headaches, neg Seizures negative psych ROS   GI/Hepatic negative GI ROS, Neg liver ROS,   Endo/Other  diabetes, Oral Hypoglycemic Agents  Renal/GU negative Renal ROS     Musculoskeletal negative musculoskeletal ROS (+)   Abdominal   Peds  Hematology negative hematology ROS (+)   Anesthesia Other Findings Past Medical History: No date: Diabetes mellitus without complication ([facility]) No date: Hypertension No date: Migraine   Reproductive/Obstetrics                            Anesthesia Physical Anesthesia Plan  ASA: III  Anesthesia Plan: General   Post-op Pain Management:    Induction: Intravenous  PONV Risk Score and Plan: 2 and Propofol infusion  Airway Management Planned: Natural Airway  Additional Equipment:   Intra-op Plan:   Post-operative Plan:   Informed Consent: I have reviewed the patients History and Physical, chart, labs and discussed the procedure including the risks, benefits and alternatives for the proposed anesthesia with the patient or authorized representative who has indicated his/her understanding and acceptance.     Dental advisory given  Plan Discussed  with: CRNA and Anesthesiologist  Anesthesia Plan Comments:         Anesthesia Quick Evaluation

## 2020-03-23 NOTE — Op Note (Signed)
[facility] Gastroenterology Patient Name: Christina Moses Procedure Date: 03/23/2020 10:11 AM MRN: [id number] Account #: [id number] Date of Birth: Nov 12, 1970 Admit Type: Outpatient Age: 48 Room: [facility] ENDO ROOM 3 Gender: Female Note Status: Finalized Procedure:             Colonoscopy Indications:           Iron deficiency anemia Providers:             Andrey Farmer MD, MD Referring MD:          No Local Md, MD (Referring MD) Medicines:             Monitored Anesthesia Care Complications:         No immediate complications. Estimated blood loss:                         Minimal. Procedure:             Pre-Anesthesia Assessment:                        - Prior to the procedure, a History and Physical was                         performed, and patient medications and allergies were                         reviewed. The patient is competent. The risks and                         benefits of the procedure and the sedation options and                         risks were discussed with the patient. All questions                         were answered and informed consent was obtained.                         Patient identification and proposed procedure were                         verified by the physician, the nurse, the anesthetist                         and the technician in the endoscopy suite. Mental                         Status Examination: alert and oriented. Airway                         Examination: normal oropharyngeal airway and neck                         mobility. Respiratory Examination: clear to                         auscultation. CV Examination: normal. Prophylactic                         Antibiotics:  The patient does not require prophylactic                         antibiotics. Prior Anticoagulants: The patient has                         taken no previous anticoagulant or antiplatelet                         agents. ASA Grade Assessment: II - A  patient with mild                         systemic disease. After reviewing the risks and                         benefits, the patient was deemed in satisfactory                         condition to undergo the procedure. The anesthesia                         plan was to use monitored anesthesia care (MAC).                         Immediately prior to administration of medications,                         the patient was re-assessed for adequacy to receive                         sedatives. The heart rate, respiratory rate, oxygen                         saturations, blood pressure, adequacy of pulmonary                         ventilation, and response to care were monitored                         throughout the procedure. The physical status of the                         patient was re-assessed after the procedure.                        After obtaining informed consent, the colonoscope was                         passed under direct vision. Throughout the procedure,                         the patient's blood pressure, pulse, and oxygen                         saturations were monitored continuously. The                         Colonoscope was introduced through the anus and  advanced to the the terminal ileum. The colonoscopy                         was performed without difficulty. The patient                         tolerated the procedure well. The quality of the bowel                         preparation was good. Findings:      The perianal and digital rectal examinations were normal.      The terminal ileum appeared normal.      A 1 mm polyp was found in the ascending colon. The polyp was sessile.       The polyp was removed with a jumbo cold forceps. Resection and retrieval       were complete. Estimated blood loss was minimal.      A 1 mm polyp was found in the hepatic flexure. The polyp was sessile.       The polyp was removed with a jumbo cold  forceps. Resection and retrieval       were complete. Estimated blood loss was minimal.      A 5 mm polyp was found in the sigmoid colon. The polyp was sessile. The       polyp was removed with a cold snare. Resection and retrieval were       complete. Estimated blood loss was minimal.      Non-bleeding internal hemorrhoids were found during retroflexion. The       hemorrhoids were Grade I (internal hemorrhoids that do not prolapse).      The exam was otherwise without abnormality on direct and retroflexion       views. Impression:            - The examined portion of the ileum was normal.                        - One 1 mm polyp in the ascending colon, removed with                         a jumbo cold forceps. Resected and retrieved.                        - One 1 mm polyp at the hepatic flexure, removed with                         a jumbo cold forceps. Resected and retrieved.                        - One 5 mm polyp in the sigmoid colon, removed with a                         cold snare. Resected and retrieved.                        - Non-bleeding internal hemorrhoids.                        - The examination was otherwise normal on direct and  retroflexion views. Recommendation:        - Discharge patient to home.                        - Resume previous diet.                        - Continue present medications.                        - Await pathology results.                        - Repeat colonoscopy for surveillance based on                         pathology results.                        - Return to referring physician as previously                         scheduled. Procedure Code(s):     --- Professional ---                        [phone], Colonoscopy, flexible; with removal of                         tumor(s), polyp(s), or other lesion(s) by snare                         technique                        45380, 28, Colonoscopy, flexible; with biopsy,  single                         or multiple Diagnosis Code(s):     --- Professional ---                        K64.0, First degree hemorrhoids                        K63.5, Polyp of colon                        D50.9, Iron deficiency anemia, unspecified CPT copyright 2019 American Medical Association. All rights reserved. The codes documented in this report are preliminary and upon coder review may  be revised to meet current compliance requirements. Andrey Farmer, MD Andrey Farmer MD, MD 03/23/2020 10:55:53 AM Number of Addenda: 0 Note Initiated On: 03/23/2020 10:11 AM Scope Withdrawal Time: 0 hours 13 minutes 8 seconds  Total Procedure Duration: 0 hours 18 minutes 25 seconds  Estimated Blood Loss:  Estimated blood loss was minimal.      [facility]

## 2020-03-23 NOTE — Anesthesia Postprocedure Evaluation (Signed)
Anesthesia Post Note  Patient: Christina Moses  Procedure(s) Performed: COLONOSCOPY WITH PROPOFOL (N/A ) ESOPHAGOGASTRODUODENOSCOPY (EGD) WITH PROPOFOL (N/A )  Patient location during evaluation: Endoscopy Anesthesia Type: General Level of consciousness: awake and alert and oriented Pain management: pain level controlled Vital Signs Assessment: post-procedure vital signs reviewed and stable Respiratory status: spontaneous breathing, nonlabored ventilation and respiratory function stable Cardiovascular status: blood pressure returned to baseline and stable Postop Assessment: no signs of nausea or vomiting Anesthetic complications: no   No complications documented.   Last Vitals:  Vitals:   03/23/20 1110 03/23/20 1120  BP: 115/77   Pulse: 97 (!) 106  Resp: (!) 27 17  Temp:    SpO2: 100% 100%    Last Pain:  Vitals:   03/23/20 1120  TempSrc:   PainSc: 0-No pain                 Zanai Mallari

## 2020-03-23 NOTE — Op Note (Signed)
[facility] Gastroenterology Patient Name: Christina Moses Procedure Date: 03/23/2020 10:12 AM MRN: [id number] Account #: [id number] Date of Birth: June 28, 1971 Admit Type: Outpatient Age: 49 Room: [facility] ENDO ROOM 3 Gender: Female Note Status: Finalized Procedure:             Upper GI endoscopy Indications:           Iron deficiency anemia Providers:             Andrey Farmer MD, MD Referring MD:          No Local Md, MD (Referring MD) Medicines:             Monitored Anesthesia Care Complications:         No immediate complications. Estimated blood loss:                         Minimal. Procedure:             Pre-Anesthesia Assessment:                        - Prior to the procedure, a History and Physical was                         performed, and patient medications and allergies were                         reviewed. The patient is competent. The risks and                         benefits of the procedure and the sedation options and                         risks were discussed with the patient. All questions                         were answered and informed consent was obtained.                         Patient identification and proposed procedure were                         verified by the physician, the nurse, the anesthetist                         and the technician in the endoscopy suite. Mental                         Status Examination: alert and oriented. Airway                         Examination: normal oropharyngeal airway and neck                         mobility. Respiratory Examination: clear to                         auscultation. CV Examination: normal. Prophylactic  Antibiotics: The patient does not require prophylactic                         antibiotics. Prior Anticoagulants: The patient has                         taken no previous anticoagulant or antiplatelet                         agents. ASA Grade Assessment: II -  A patient with mild                         systemic disease. After reviewing the risks and                         benefits, the patient was deemed in satisfactory                         condition to undergo the procedure. The anesthesia                         plan was to use monitored anesthesia care (MAC).                         Immediately prior to administration of medications,                         the patient was re-assessed for adequacy to receive                         sedatives. The heart rate, respiratory rate, oxygen                         saturations, blood pressure, adequacy of pulmonary                         ventilation, and response to care were monitored                         throughout the procedure. The physical status of the                         patient was re-assessed after the procedure.                        After obtaining informed consent, the endoscope was                         passed under direct vision. Throughout the procedure,                         the patient's blood pressure, pulse, and oxygen                         saturations were monitored continuously. The Endoscope                         was introduced through the mouth, and advanced to the  second part of duodenum. The upper GI endoscopy was                         accomplished without difficulty. The patient tolerated                         the procedure well. Findings:      The examined esophagus was normal.      A single localized less than 1 mm erosion with no bleeding and no       stigmata of recent bleeding was found in the gastric antrum. Biopsies       were taken with a cold forceps for Helicobacter pylori testing.       Estimated blood loss was minimal.      A medium non-bleeding diverticulum was found in the second portion of       the duodenum.      The exam of the duodenum was otherwise normal. Impression:            - Normal esophagus.                         - Erosive gastropathy with no bleeding and no stigmata                         of recent bleeding. Biopsied.                        - Non-bleeding duodenal diverticulum. Recommendation:        - Discharge patient to home.                        - Resume previous diet.                        - Continue present medications.                        - Await pathology results.                        - Return to referring physician as previously                         scheduled. Procedure Code(s):     --- Professional ---                        [phone], Esophagogastroduodenoscopy, flexible,                         transoral; with biopsy, single or multiple Diagnosis Code(s):     --- Professional ---                        K31.89, Other diseases of stomach and duodenum                        D50.9, Iron deficiency anemia, unspecified                        K57.10, Diverticulosis of small intestine without  perforation or abscess without bleeding CPT copyright 2019 [employer]. All rights reserved. The codes documented in this report are preliminary and upon coder review may  be revised to meet current compliance requirements. Andrey Farmer, MD Andrey Farmer MD, MD 03/23/2020 10:52:14 AM Number of Addenda: 0 Note Initiated On: 03/23/2020 10:12 AM Estimated Blood Loss:  Estimated blood loss was minimal.      [facility]

## 2020-03-26 ENCOUNTER — Encounter: Payer: Self-pay | Admitting: Gastroenterology

## 2020-03-27 LAB — SURGICAL PATHOLOGY

## 2020-06-08 ENCOUNTER — Other Ambulatory Visit
Admission: RE | Admit: 2020-06-08 | Discharge: 2020-06-08 | Disposition: A | Discharge status: Home / Self Care | Payer: Federal, State, Local not specified - PPO | Source: Ambulatory Visit | Attending: Cardiology | Admitting: Cardiology

## 2020-06-08 ENCOUNTER — Other Ambulatory Visit: Payer: Self-pay

## 2020-06-08 DIAGNOSIS — Z20822 Contact with and (suspected) exposure to covid-19: Secondary | ICD-10-CM | POA: Insufficient documentation

## 2020-06-08 DIAGNOSIS — Z01812 Encounter for preprocedural laboratory examination: Secondary | ICD-10-CM | POA: Diagnosis present

## 2020-06-08 LAB — SARS CORONAVIRUS 2 (TAT 6-24 HRS): SARS Coronavirus 2: NEGATIVE

## 2020-06-11 ENCOUNTER — Other Ambulatory Visit: Payer: Self-pay | Admitting: Cardiology

## 2020-06-12 ENCOUNTER — Encounter
Admission: RE | Disposition: A | Discharge status: Home / Self Care | Payer: Self-pay | Source: Home / Self Care | Attending: Cardiology

## 2020-06-12 ENCOUNTER — Other Ambulatory Visit: Payer: Self-pay

## 2020-06-12 ENCOUNTER — Ambulatory Visit
Admission: RE | Admit: 2020-06-12 | Discharge: 2020-06-12 | Disposition: A | Discharge status: Home / Self Care | Payer: Federal, State, Local not specified - PPO | Attending: Cardiology | Admitting: Cardiology

## 2020-06-12 ENCOUNTER — Ambulatory Visit: Payer: Federal, State, Local not specified - PPO | Admitting: Certified Registered Nurse Anesthetist

## 2020-06-12 ENCOUNTER — Encounter: Payer: Self-pay | Admitting: Cardiology

## 2020-06-12 DIAGNOSIS — E119 Type 2 diabetes mellitus without complications: Secondary | ICD-10-CM | POA: Insufficient documentation

## 2020-06-12 DIAGNOSIS — I10 Essential (primary) hypertension: Secondary | ICD-10-CM | POA: Insufficient documentation

## 2020-06-12 DIAGNOSIS — Z7901 Long term (current) use of anticoagulants: Secondary | ICD-10-CM | POA: Diagnosis not present

## 2020-06-12 DIAGNOSIS — Z79899 Other long term (current) drug therapy: Secondary | ICD-10-CM | POA: Insufficient documentation

## 2020-06-12 DIAGNOSIS — D649 Anemia, unspecified: Secondary | ICD-10-CM | POA: Insufficient documentation

## 2020-06-12 DIAGNOSIS — I480 Paroxysmal atrial fibrillation: Secondary | ICD-10-CM | POA: Diagnosis not present

## 2020-06-12 DIAGNOSIS — Z6841 Body Mass Index (BMI) 40.0 and over, adult: Secondary | ICD-10-CM | POA: Diagnosis not present

## 2020-06-12 DIAGNOSIS — Z7984 Long term (current) use of oral hypoglycemic drugs: Secondary | ICD-10-CM | POA: Insufficient documentation

## 2020-06-12 HISTORY — PX: CARDIOVERSION: SHX1299

## 2020-06-12 LAB — GLUCOSE, CAPILLARY: Glucose-Capillary: 236 mg/dL — ABNORMAL HIGH (ref 70–99)

## 2020-06-12 LAB — PREGNANCY, URINE: Preg Test, Ur: NEGATIVE

## 2020-06-12 SURGERY — CARDIOVERSION
Anesthesia: General

## 2020-06-12 MED ORDER — PROPOFOL 10 MG/ML IV BOLUS
INTRAVENOUS | Status: DC | PRN
  Administered 2020-06-12: 20 mg via INTRAVENOUS
  Administered 2020-06-12: 40 mg via INTRAVENOUS
  Administered 2020-06-12 (×2): 20 mg via INTRAVENOUS
  Administered 2020-06-12: 10 mg via INTRAVENOUS
  Administered 2020-06-12: 30 mg via INTRAVENOUS

## 2020-06-12 MED ORDER — PROPOFOL 10 MG/ML IV BOLUS
INTRAVENOUS | Status: AC
  Filled 2020-06-12: qty 40

## 2020-06-12 MED ORDER — SODIUM CHLORIDE 0.9 % IV SOLN: INTRAVENOUS | Status: DC

## 2020-06-12 NOTE — Anesthesia Preprocedure Evaluation (Signed)
Anesthesia Evaluation  Patient identified by MRN, date of birth, ID band Patient awake    Reviewed: Allergy & Precautions, NPO status , Patient's Chart, lab work & pertinent test results  History of Anesthesia Complications Negative for: history of anesthetic complications  Airway Mallampati: III       Dental   Pulmonary neg sleep apnea, neg COPD, Not current smoker,           Cardiovascular hypertension, Pt. on medications (-) Past MI and (-) CHF + dysrhythmias Atrial Fibrillation (-) Valvular Problems/Murmurs     Neuro/Psych neg Seizures    GI/Hepatic Neg liver ROS, neg GERD  ,  Endo/Other  diabetes, Type 2, Oral Hypoglycemic Agents  Renal/GU negative Renal ROS     Musculoskeletal   Abdominal   Peds  Hematology   Anesthesia Other Findings   Reproductive/Obstetrics                             Anesthesia Physical Anesthesia Plan  ASA: III  Anesthesia Plan: General   Post-op Pain Management:    Induction: Intravenous  PONV Risk Score and Plan: 3 and Propofol infusion and TIVA  Airway Management Planned: Nasal Cannula  Additional Equipment:   Intra-op Plan:   Post-operative Plan:   Informed Consent: I have reviewed the patients History and Physical, chart, labs and discussed the procedure including the risks, benefits and alternatives for the proposed anesthesia with the patient or authorized representative who has indicated his/her understanding and acceptance.       Plan Discussed with:   Anesthesia Plan Comments:         Anesthesia Quick Evaluation

## 2020-06-12 NOTE — Transfer of Care (Signed)
Immediate Anesthesia Transfer of Care Note  Patient: Christina Moses  Procedure(s) Performed: CARDIOVERSION (N/A )  Patient Location: PACU  Anesthesia Type:General  Level of Consciousness: awake, alert  and oriented  Airway & Oxygen Therapy: Patient Spontanous Breathing and Patient connected to nasal cannula oxygen  Post-op Assessment: Report given to RN and Post -op Vital signs reviewed and stable  Post vital signs: Reviewed and stable  Last Vitals:  Vitals Value Taken Time  BP 142/78 06/12/20 0824  Temp    Pulse 74 06/12/20 0825  Resp 21 06/12/20 0825  SpO2 98 % 06/12/20 0825  Vitals shown include unvalidated device data.  Last Pain:  Vitals:   06/12/20 0716  TempSrc: Oral  PainSc: 0-No pain         Complications: No complications documented.

## 2020-06-12 NOTE — Procedures (Signed)
Procedure:  Cardioversion  Indication:  Symptomatic afib  After informed consent, time out protocol and adequate sedation per department of anesthesia, pt received a 120J and 150 J synchronized biphasic dc shock with conversion to nsr. No immediate complications.

## 2020-06-12 NOTE — Anesthesia Postprocedure Evaluation (Signed)
Anesthesia Post Note  Patient: Christina Moses  Procedure(s) Performed: CARDIOVERSION (N/A )  Patient location during evaluation: Specials Recovery Anesthesia Type: General Level of consciousness: awake and alert Pain management: pain level controlled Vital Signs Assessment: post-procedure vital signs reviewed and stable Respiratory status: spontaneous breathing and respiratory function stable Cardiovascular status: stable Anesthetic complications: no   No complications documented.   Last Vitals:  Vitals:   06/12/20 0845 06/12/20 0900  BP: (!) 153/84 (!) 161/81  Pulse: 76 77  Resp: 20 (!) 24  Temp:    SpO2: 98% 96%    Last Pain:  Vitals:   06/12/20 0900  TempSrc:   PainSc: 0-No pain                 Milbert Bixler K

## 2020-06-12 NOTE — H&P (Signed)
Chief Complaint: Chief Complaint  Patient presents with  . Ankle Swelling  BIL;Occas  . Foot Swelling  BIL;Occas  . Leg Swelling  BIL;Occas  Date of Service: 05/18/2020 Date of Birth: 07-26-1970 PCP: [facility]  History of Present Illness: [name] is a 49 y.o.female patient with a past medical history significant for atrial fibrillation, anticoagulated with Eliquis, type 2 diabetes, hypertension, morbid obesity,and anemia who presents for a follow up visit. She recently underwent an upper endoscopy and colonoscopy for evaluation for anemia; no significant abnormalities found. Hemoglobin has improved with iron supplementation. From a cardiac standpoint, she continues to experience intermittent palpitations with associated chest discomfort, as well as bilateral lower extremity swelling. She remains on Lasix 94m daily and takes an additional 441mfor increased swelling. She denies orthopnea or PND. She denies syncopal or presyncopal episodes. She remains on Eliquis 33m42mID with no evidence of bleeding.   I personally reviewed the ECG performed in the office today which revealed atrial fibrillation with controlled ventricular response; global ST abnormalities - unchanged from previous ECGs. Holter monitor on 12/28/19 revealed sustained atrial fibrillation with occurrences of RVR; average rate was 96bpm. Echocardiogram on 01/25/19 revealed normal LV systolic function with an EF estimated greater than 55% with moderate LVH, mild MR, and mild TR with borderline elevated right sided pressures.   Past Medical and Surgical History  Past Medical History Past Medical History:  Diagnosis Date  . Gestational diabetes  . Hyperglycemia 05/22/2014  . Migraine  . Obesity  . Seasonal allergies  . 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    Medications and Allergies  Current Medications  Current Outpatient Medications  Medication Sig Dispense Refill  . apixaban (ELIQUIS) 5 mg tablet Take 1 tablet (5 mg total) by mouth every 12 (twelve) hours 60 tablet 11  . blood glucose diagnostic (ACCU-CHEK GUIDE TEST STRIPS) test strip Use once daily 100 strip 4  . blood glucose meter kit Check fasting blood sugar daily E11.9 1 each 0  . digoxin (LANOXIN) 0.25 MG tablet Take 1 tablet (0.25 mg total) by mouth once daily 90 tablet 3  . diltiazem (CARDIZEM CD) 300 MG CD capsule Take 1 capsule (300 mg total) by mouth once daily 30 capsule 11  . ferrous sulfate 325 (65 FE) MG tablet Take 325 mg by mouth daily with breakfast  . FUROsemide (LASIX) 40 MG tablet Take 1 tablet (40 mg total) by mouth once daily 30 tablet 11  . ibuprofen (MOTRIN) 600 MG tablet Take 600 mg by mouth every 6 (six) hours as needed  . magnesium citrate oral solution Take 296 mLs by mouth once  . metFORMIN (GLUCOPHAGE) 500 MG tablet Take 1 tablet (500 mg total) by mouth 2 (two) times daily with meals 120 tablet 11  . pantoprazole (PROTONIX) 40 MG DR tablet Take 1 tablet (40 mg total) by mouth 2 (two) times daily before meals Take 30 minutes before breakfast and dinner. 60 tablet 3  . rosuvastatin (CRESTOR) 5 MG tablet Take 1 tablet (5 mg total) by mouth once daily 90 tablet 3  . sodium, potassium, and magnesium (SUPREP) oral solution Take 1 Bottle by mouth as directed One kit contains 2 bottles. Take both bottles at the times instructed by your provider. 354 mL 0  . SUMAtriptan (IMITREX) 50 MG tablet 1 tablet(s) by mouth at onset of headache, repeat  every 2 hours until relief up to maximum 4 tablets in 24 hours 30 tablet 4  . tobramycin-dexamethasone (TOBRADEX) 0.3-0.1 % ophthalmic suspension PLACE 2 (TWO) DROPS IN BOTH EYES EVERY 6 (SIX) HOURS 2  . triamcinolone 0.1 % cream Apply topically 2 (two) times daily 30 g 0  . drospirenone-ethinyl estradioL (YASMIN) 3-0.03 mg tablet  Take 1 tablet by mouth once daily (Patient not taking: Reported on 05/18/2020 ) 84 tablet 11   No current facility-administered medications for this visit.   Allergies: Patient has no known allergies.  Social and Family History  Social History reports that she has never smoked. She has never used smokeless tobacco. She reports that she does not drink alcohol and does not use drugs.  Family History Family History  Problem Relation Age of Onset  . High blood pressure (Hypertension) Father  . Diabetes type II Father  . Osteoporosis (Thinning of bones) Maternal Grandmother  . COPD Maternal Grandmother  . Rheum arthritis Mother  . Alzheimer's disease Paternal Grandmother  . No Known Problems Brother  . No Known Problems Son  . No Known Problems Brother  . No Known Problems Son  . No Known Problems Son  . No Known Problems Son   Review of Systems   Review of Systems: The patient denies chest pain, shortness of breath, orthopnea, paroxysmal nocturnal dyspnea, pedal edema, palpitations, heart racing, fatigue, dizziness, lightheadedness, presyncope, syncope, leg pain, leg cramping. Review of 12 Systems is negative except as described in HPI.   Physical Examination   Vitals:BP 150/82 (BP Location: Left upper arm, Patient Position: Sitting, BP Cuff Size: Large Adult)  Pulse 92  Resp 14  Ht 160 cm (_0 )  Wt (!) 113.4 kg (250 lb)  LMP 04/17/2020  SpO2 97%  BMI 44.29 kg/m  Ht:160 cm (_1 ) Wt:(!) 113.4 kg (250 lb) HKU:VJDY surface area is 2.25 meters squared. Body mass index is 44.29 kg/m.  General: Well developed, obese. In no acute distress HEENT: Pupils equally reactive to light and accomodation  Neck: Supple without thyromegaly, or goiter. Carotid pulses 2+. No carotid bruits present.  Pulmonary: Clear to auscultation bilaterally; no wheezes, rales, rhonchi Cardiovascular: Irregularly irregular, controlled ventricular response. No gallops, murmurs or rubs Gastrointestinal:  Soft nontender, nondistended, with normal bowel sounds Extremities: No cyanosis or clubbing. Trace to mild peripheral edema noted in bilateral lower extremities  Peripheral Pulses: 2+ in upper extremities, 2+ in lower extremities  Neurology: Alert and oriented X3 Pysch: Good affect. Responds appropriately  Assessment and Plan   49 y.o. female with  1. Essential hypertension  -Continue diltiazem 392m daily and Lasix 460mdaily  2. Diabetes mellitus type 2 in obese (CMS-HCC) -Continue current medication regimen and routine follow-up with PCP  3. BMI 45.0-49.9, adult (CMS-HCC)  -Stressed the importance of weight loss through dietary modifications and routine physical activity  4.  5.   6. New onset atrial fibrillation (CMS-HCC)  -Holter revealed sustained atrial fibrillation; remain in atrial fibrillation today  -Will continue diltiazem 30085maily and digoxin .25m60mce daily; metoprolol not tolerated well in the past  -Hemoglobin stable, will continue Eliquis 5mg 63m  -Cardioversion scheduled  -Sleep study revealing moderate OSA; encouraged to consider CPAP Moderate OSA -Encouraged trial with CPAP; patient hesitant but will re-consider at follow up visit  Hypomagnesemia  -Continue magnesium citrate 250mg 1m   KennetChrissie Noath MD  Pt seen and examined. No change from above.

## 2023-02-18 ENCOUNTER — Other Ambulatory Visit: Payer: Self-pay | Admitting: Physician Assistant

## 2023-02-18 DIAGNOSIS — Z1231 Encounter for screening mammogram for malignant neoplasm of breast: Secondary | ICD-10-CM

## 2023-03-19 ENCOUNTER — Ambulatory Visit
Admission: RE | Admit: 2023-03-19 | Discharge: 2023-03-19 | Disposition: A | Discharge status: Home / Self Care | Payer: Federal, State, Local not specified - PPO | Source: Ambulatory Visit | Attending: Physician Assistant | Admitting: Physician Assistant

## 2023-03-19 DIAGNOSIS — Z1231 Encounter for screening mammogram for malignant neoplasm of breast: Secondary | ICD-10-CM | POA: Diagnosis present

## 2023-03-26 ENCOUNTER — Other Ambulatory Visit: Payer: Self-pay | Admitting: Physician Assistant

## 2023-03-26 DIAGNOSIS — R928 Other abnormal and inconclusive findings on diagnostic imaging of breast: Secondary | ICD-10-CM

## 2023-03-26 DIAGNOSIS — N63 Unspecified lump in unspecified breast: Secondary | ICD-10-CM

## 2023-03-31 ENCOUNTER — Ambulatory Visit
Admission: RE | Admit: 2023-03-31 | Discharge: 2023-03-31 | Disposition: A | Discharge status: Home / Self Care | Payer: Federal, State, Local not specified - PPO | Source: Ambulatory Visit | Attending: Physician Assistant | Admitting: Physician Assistant

## 2023-03-31 DIAGNOSIS — N63 Unspecified lump in unspecified breast: Secondary | ICD-10-CM

## 2023-03-31 DIAGNOSIS — R928 Other abnormal and inconclusive findings on diagnostic imaging of breast: Secondary | ICD-10-CM | POA: Insufficient documentation

## 2023-04-01 ENCOUNTER — Encounter: Payer: Self-pay | Admitting: Physician Assistant

## 2023-04-02 ENCOUNTER — Other Ambulatory Visit: Payer: Self-pay | Admitting: Physician Assistant

## 2023-04-02 DIAGNOSIS — N63 Unspecified lump in unspecified breast: Secondary | ICD-10-CM

## 2023-04-02 DIAGNOSIS — R928 Other abnormal and inconclusive findings on diagnostic imaging of breast: Secondary | ICD-10-CM

## 2023-04-09 ENCOUNTER — Ambulatory Visit
Admission: RE | Admit: 2023-04-09 | Discharge: 2023-04-09 | Disposition: A | Discharge status: Home / Self Care | Payer: Federal, State, Local not specified - PPO | Source: Ambulatory Visit | Attending: Physician Assistant | Admitting: Physician Assistant

## 2023-04-09 DIAGNOSIS — N63 Unspecified lump in unspecified breast: Secondary | ICD-10-CM | POA: Diagnosis present

## 2023-04-09 DIAGNOSIS — R928 Other abnormal and inconclusive findings on diagnostic imaging of breast: Secondary | ICD-10-CM | POA: Diagnosis present

## 2023-04-09 HISTORY — PX: BREAST BIOPSY: SHX20

## 2023-04-09 MED ORDER — LIDOCAINE 1 % OPTIME INJ - NO CHARGE
2.0000 mL | Freq: Once | INTRAMUSCULAR | Status: AC
  Administered 2023-04-09: 2 mL via INTRADERMAL
  Filled 2023-04-09: qty 2

## 2023-04-09 MED ORDER — LIDOCAINE-EPINEPHRINE 1 %-1:100000 IJ SOLN
8.0000 mL | Freq: Once | INTRAMUSCULAR | Status: AC
  Administered 2023-04-09: 8 mL
  Filled 2023-04-09: qty 8

## 2023-04-10 ENCOUNTER — Encounter: Payer: Self-pay | Admitting: *Deleted

## 2023-04-10 LAB — SURGICAL PATHOLOGY

## 2023-04-10 NOTE — Progress Notes (Signed)
Received referral for newly diagnosed breast cancer from [facility].  Navigation initiated.  [facility]. Spoke with her mother, as she is currently inpatient at [facility] for cardiac issues.   Her mother is aware that information has been faxed to the [facility].
